# Patient Record
Sex: Male | Born: 1965 | Race: White | Hispanic: No | Smoking: Current every day smoker
Health system: Southern US, Community
[De-identification: ages and names within clinical notes are randomized; demographics above are authoritative.]

## PROBLEM LIST (undated history)

## (undated) DIAGNOSIS — E119 Type 2 diabetes mellitus without complications: Secondary | ICD-10-CM

## (undated) DIAGNOSIS — I10 Essential (primary) hypertension: Secondary | ICD-10-CM

## (undated) HISTORY — PX: HERNIA REPAIR: SHX51

---

## 2019-10-29 ENCOUNTER — Emergency Department (INDEPENDENT_AMBULATORY_CARE_PROVIDER_SITE_OTHER): Payer: BC Managed Care – PPO

## 2019-10-29 ENCOUNTER — Emergency Department
Admission: EM | Admit: 2019-10-29 | Discharge: 2019-10-29 | Disposition: A | Discharge status: Home / Self Care | Payer: Self-pay | Source: Home / Self Care | Attending: Emergency Medicine | Admitting: Emergency Medicine

## 2019-10-29 ENCOUNTER — Other Ambulatory Visit: Payer: Self-pay

## 2019-10-29 ENCOUNTER — Encounter: Payer: Self-pay | Admitting: Emergency Medicine

## 2019-10-29 DIAGNOSIS — R1031 Right lower quadrant pain: Secondary | ICD-10-CM

## 2019-10-29 DIAGNOSIS — M5441 Lumbago with sciatica, right side: Secondary | ICD-10-CM

## 2019-10-29 DIAGNOSIS — R739 Hyperglycemia, unspecified: Secondary | ICD-10-CM

## 2019-10-29 DIAGNOSIS — M545 Low back pain: Secondary | ICD-10-CM

## 2019-10-29 DIAGNOSIS — T148XXA Other injury of unspecified body region, initial encounter: Secondary | ICD-10-CM

## 2019-10-29 DIAGNOSIS — M5442 Lumbago with sciatica, left side: Secondary | ICD-10-CM

## 2019-10-29 DIAGNOSIS — S161XXA Strain of muscle, fascia and tendon at neck level, initial encounter: Secondary | ICD-10-CM | POA: Diagnosis not present

## 2019-10-29 DIAGNOSIS — R109 Unspecified abdominal pain: Secondary | ICD-10-CM

## 2019-10-29 DIAGNOSIS — M47892 Other spondylosis, cervical region: Secondary | ICD-10-CM

## 2019-10-29 HISTORY — DX: Type 2 diabetes mellitus without complications: E11.9

## 2019-10-29 HISTORY — DX: Essential (primary) hypertension: I10

## 2019-10-29 LAB — POCT URINALYSIS DIP (MANUAL ENTRY)
Bilirubin, UA: NEGATIVE
Blood, UA: NEGATIVE
Glucose, UA: 500 mg/dL — AB
Leukocytes, UA: NEGATIVE
Nitrite, UA: NEGATIVE
Protein Ur, POC: NEGATIVE mg/dL
Spec Grav, UA: 1.025 (ref 1.010–1.025)
Urobilinogen, UA: 1 E.U./dL
pH, UA: 5.5 (ref 5.0–8.0)

## 2019-10-29 LAB — COMPLETE METABOLIC PANEL WITH GFR
AG Ratio: 2.1 (calc) (ref 1.0–2.5)
ALT: 13 U/L (ref 9–46)
AST: 17 U/L (ref 10–35)
Albumin: 4.6 g/dL (ref 3.6–5.1)
Alkaline phosphatase (APISO): 84 U/L (ref 35–144)
BUN: 13 mg/dL (ref 7–25)
CO2: 22 mmol/L (ref 20–32)
Calcium: 9.5 mg/dL (ref 8.6–10.3)
Chloride: 100 mmol/L (ref 98–110)
Creat: 0.82 mg/dL (ref 0.70–1.33)
GFR, Est African American: 119 mL/min/{1.73_m2} (ref >=60)
GFR, Est Non African American: 102 mL/min/{1.73_m2} (ref >=60)
Globulin: 2.2 g/dL (calc) (ref 1.9–3.7)
Glucose, Bld: 289 mg/dL — ABNORMAL HIGH (ref 65–99)
Potassium: 4.4 mmol/L (ref 3.5–5.3)
Sodium: 133 mmol/L — ABNORMAL LOW (ref 135–146)
Total Bilirubin: 0.8 mg/dL (ref 0.2–1.2)
Total Protein: 6.8 g/dL (ref 6.1–8.1)

## 2019-10-29 LAB — POCT CBC W AUTO DIFF (K'VILLE URGENT CARE)

## 2019-10-29 LAB — POCT FASTING CBG KUC MANUAL ENTRY: POCT Glucose (KUC): 281 mg/dL — AB (ref 70–99)
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
## 2019-10-29 NOTE — Discharge Instructions (Addendum)
Take Tylenol for pain. Your sugar was present in your urine please contact your family doctor. If your kidney function is normal we will proceed with a CT of your abdomen and pelvis tomorrow to check on the status of your hernia repair mesh Please follow-up with your surgeon tomorrow. Please follow-up with your family doctor. Glucose in the office was 281
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
## 2019-10-30 ENCOUNTER — Telehealth: Payer: Self-pay | Admitting: Emergency Medicine

## 2019-10-30 NOTE — Telephone Encounter (Signed)
I spoke with patient. He will check with his surgeon today to see if he can be seen before he has any more imaging. He feels well. No GI symptoms.He will call me if any problems. He was given my cell number. He was also advised to F/U with his PCP about his diabetes. He admits to not taking his meds.
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
